# Patient Record
Sex: Male | Born: 1965 | Race: White | Hispanic: No | Marital: Married | Smoking: Never smoker
Health system: Southern US, Community
[De-identification: ages and names within clinical notes are randomized; demographics above are authoritative.]

## PROBLEM LIST (undated history)

## (undated) DIAGNOSIS — E7800 Pure hypercholesterolemia, unspecified: Secondary | ICD-10-CM

## (undated) DIAGNOSIS — I10 Essential (primary) hypertension: Secondary | ICD-10-CM

## (undated) DIAGNOSIS — E119 Type 2 diabetes mellitus without complications: Secondary | ICD-10-CM

## (undated) HISTORY — PX: KIDNEY STONE SURGERY: SHX686

## (undated) HISTORY — PX: TYMPANOSTOMY TUBE PLACEMENT: SHX32

## (undated) HISTORY — PX: FOOT SURGERY: SHX648

## (undated) HISTORY — PX: CORNEAL TRANSPLANT: SHX108

## (undated) HISTORY — PX: TONSILLECTOMY AND ADENOIDECTOMY: SUR1326

---

## 1999-06-01 ENCOUNTER — Ambulatory Visit (HOSPITAL_COMMUNITY): Admission: RE | Admit: 1999-06-01 | Discharge: 1999-06-01 | Payer: Self-pay | Admitting: General Surgery

## 2001-04-15 ENCOUNTER — Other Ambulatory Visit: Admission: RE | Admit: 2001-04-15 | Discharge: 2001-04-15 | Payer: Self-pay | Admitting: Otolaryngology

## 2001-04-29 ENCOUNTER — Ambulatory Visit (HOSPITAL_COMMUNITY): Admission: RE | Admit: 2001-04-29 | Discharge: 2001-04-29 | Payer: Self-pay | Admitting: Otolaryngology

## 2001-04-29 ENCOUNTER — Encounter: Payer: Self-pay | Admitting: Otolaryngology

## 2001-06-19 ENCOUNTER — Ambulatory Visit (HOSPITAL_BASED_OUTPATIENT_CLINIC_OR_DEPARTMENT_OTHER): Admission: RE | Admit: 2001-06-19 | Discharge: 2001-06-19 | Payer: Self-pay | Admitting: Otolaryngology

## 2014-09-26 ENCOUNTER — Encounter (HOSPITAL_BASED_OUTPATIENT_CLINIC_OR_DEPARTMENT_OTHER): Payer: Self-pay

## 2014-09-26 ENCOUNTER — Emergency Department (HOSPITAL_BASED_OUTPATIENT_CLINIC_OR_DEPARTMENT_OTHER)
Admission: EM | Admit: 2014-09-26 | Discharge: 2014-09-26 | Disposition: A | Discharge status: Home / Self Care | Payer: BC Managed Care – PPO | Attending: Emergency Medicine | Admitting: Emergency Medicine

## 2014-09-26 ENCOUNTER — Emergency Department (HOSPITAL_BASED_OUTPATIENT_CLINIC_OR_DEPARTMENT_OTHER): Payer: BC Managed Care – PPO

## 2014-09-26 DIAGNOSIS — X58XXXA Exposure to other specified factors, initial encounter: Secondary | ICD-10-CM | POA: Insufficient documentation

## 2014-09-26 DIAGNOSIS — Y998 Other external cause status: Secondary | ICD-10-CM | POA: Insufficient documentation

## 2014-09-26 DIAGNOSIS — I10 Essential (primary) hypertension: Secondary | ICD-10-CM | POA: Insufficient documentation

## 2014-09-26 DIAGNOSIS — Y9389 Activity, other specified: Secondary | ICD-10-CM | POA: Insufficient documentation

## 2014-09-26 DIAGNOSIS — E119 Type 2 diabetes mellitus without complications: Secondary | ICD-10-CM | POA: Diagnosis not present

## 2014-09-26 DIAGNOSIS — S86911A Strain of unspecified muscle(s) and tendon(s) at lower leg level, right leg, initial encounter: Secondary | ICD-10-CM | POA: Diagnosis not present

## 2014-09-26 DIAGNOSIS — Y9289 Other specified places as the place of occurrence of the external cause: Secondary | ICD-10-CM | POA: Diagnosis not present

## 2014-09-26 DIAGNOSIS — E780 Pure hypercholesterolemia: Secondary | ICD-10-CM | POA: Diagnosis not present

## 2014-09-26 DIAGNOSIS — R52 Pain, unspecified: Secondary | ICD-10-CM

## 2014-09-26 DIAGNOSIS — S8991XA Unspecified injury of right lower leg, initial encounter: Secondary | ICD-10-CM | POA: Diagnosis present

## 2014-09-26 HISTORY — DX: Pure hypercholesterolemia, unspecified: E78.00

## 2014-09-26 HISTORY — DX: Essential (primary) hypertension: I10

## 2014-09-26 HISTORY — DX: Type 2 diabetes mellitus without complications: E11.9

## 2014-09-26 NOTE — Discharge Instructions (Signed)
Joint Sprain °A sprain is a tear or stretch in the ligaments that hold a joint together. Severe sprains may need as Sailer as 3-6 weeks of immobilization and/or exercises to heal completely. Sprained joints should be rested and protected. If not, they can become unstable and prone to re-injury. Proper treatment can reduce your pain, shorten the period of disability, and reduce the risk of repeated injuries. °TREATMENT  °· Rest and elevate the injured joint to reduce pain and swelling. °· Apply ice packs to the injury for 20-30 minutes every 2-3 hours for the next 2-3 days. °· Keep the injury wrapped in a compression bandage or splint as Biegler as the joint is painful or as instructed by your caregiver. °· Do not use the injured joint until it is completely healed to prevent re-injury and chronic instability. Follow the instructions of your caregiver. °· Sam-term sprain management may require exercises and/or treatment by a physical therapist. Taping or special braces may help stabilize the joint until it is completely better. °SEEK MEDICAL CARE IF:  °· You develop increased pain or swelling of the joint. °· You develop increasing redness and warmth of the joint. °· You develop a fever. °· It becomes stiff. °· Your hand or foot gets cold or numb. °Document Released: 10/24/2004 Document Revised: 12/09/2011 Document Reviewed: 10/03/2008 °[employer]® Patient Information ©2015 [employer]. This information is not intended to replace advice given to you by your health care provider. Make sure you discuss any questions you have with your health care provider. ° °

## 2014-09-26 NOTE — ED Notes (Signed)
Pt reports was walking in gas station and felt a pop behind right knee.  Pt reports pain with weight bearing.  Pt wearing knee brace and using crutches upon arrival.  Pt reports taking motrin around the clock.

## 2014-09-26 NOTE — ED Provider Notes (Signed)
CSN: [id number]     Arrival date & time 09/26/14  1136 History   First MD Initiated Contact with Patient 09/26/14 1146     Chief Complaint  Patient presents with  . Knee Pain     (Consider location/radiation/quality/duration/timing/severity/associated sxs/prior Treatment) HPI Comments: Pt states that he has been having posterior right knee pain for 2 weeks and then yesterday he stood up and felt a pop and he has been having pain since. Denies swelling redness or warmth. Has been using a brace and crutches and taking motrin  Patient is a 48 y.o. male presenting with knee pain. The history is provided by the patient.  Knee Pain Location:  Knee Injury: no   Knee location:  R knee Pain details:    Quality:  Aching   Severity:  Moderate   Onset quality:  Gradual Chronicity:  New Dislocation: no   Foreign body present:  No foreign bodies Worsened by:  Activity   Past Medical History  Diagnosis Date  . Hypertension   . High cholesterol   . Diabetes mellitus without complication    Past Surgical History  Procedure Laterality Date  . Corneal transplant    . Foot surgery    . Tympanostomy tube placement    . Kidney stone surgery    . Tonsillectomy and adenoidectomy     No family history on file. History  Substance Use Topics  . Smoking status: Never Smoker   . Smokeless tobacco: Not on file  . Alcohol Use: Yes    Review of Systems  All other systems reviewed and are negative.     Allergies  Review of patient's allergies indicates no known allergies.  Home Medications   Prior to Admission medications   Medication Sig Start Date End Date Taking? Authorizing Provider  GLYBURIDE PO Take by mouth.   Yes Historical Provider, MD  LOSARTAN POTASSIUM PO Take by mouth.   Yes Historical Provider, MD  METFORMIN HCL ER, MOD, PO Take by mouth.   Yes Historical Provider, MD  SIMVASTATIN PO Take by mouth.   Yes Historical Provider, MD   BP 151/90 mmHg  Pulse 95  Temp(Src)  98.3 F (36.8 C) (Oral)  Resp 18  Ht 6' (1.829 m)  Wt 315 lb (142.883 kg)  BMI 42.71 kg/m2  SpO2 97% Physical Exam  Constitutional: He is oriented to person, place, and time. He appears well-developed and well-nourished.  Cardiovascular: Normal rate and regular rhythm.   Pulmonary/Chest: Effort normal.  Musculoskeletal: Normal range of motion.  Full rom or right knee. No deformity, redness warmth or swelling to the area. Pulses intact. No laxity noted in knee  Neurological: He is alert and oriented to person, place, and time. He exhibits normal muscle tone. Coordination normal.  Nursing note and vitals reviewed.   ED Course  Procedures (including critical care time) Labs Review Labs Reviewed - No data to display  Imaging Review Dg Knee 2 Views Right  09/26/2014   CLINICAL DATA:  Popping sensation in the knee with posterior pain.  EXAM: RIGHT KNEE - 1-2 VIEW  COMPARISON:  None.  FINDINGS: No definite joint effusion. No acute osseous abnormality. Minimal patellofemoral osteophytosis. Otherwise, no degenerative changes.  IMPRESSION: No acute findings to explain the patient's pain.   Electronically Signed   By: Leanna BattlesMelinda  Blietz M.D.   On: 09/26/2014 12:15     EKG Interpretation None      MDM   Final diagnoses:  Knee strain, right, initial encounter  Pt to continue to wear a brace. Pt is to see ortho when he gets home. Pt has full rom.no effusion noted    Teressa LowerVrinda Margues Filippini, NP 09/26/14 1307  Purvis SheffieldForrest Harrison, MD 09/27/14 1008

## 2016-05-29 IMAGING — CR DG KNEE 1-2V*R*
2 series · 2 of 2 positions shown · non-contrast
Comparison: None.

CLINICAL DATA: Popping sensation in the knee with posterior pain.

EXAM:
RIGHT KNEE - 1-2 VIEW

[t knee ap right]
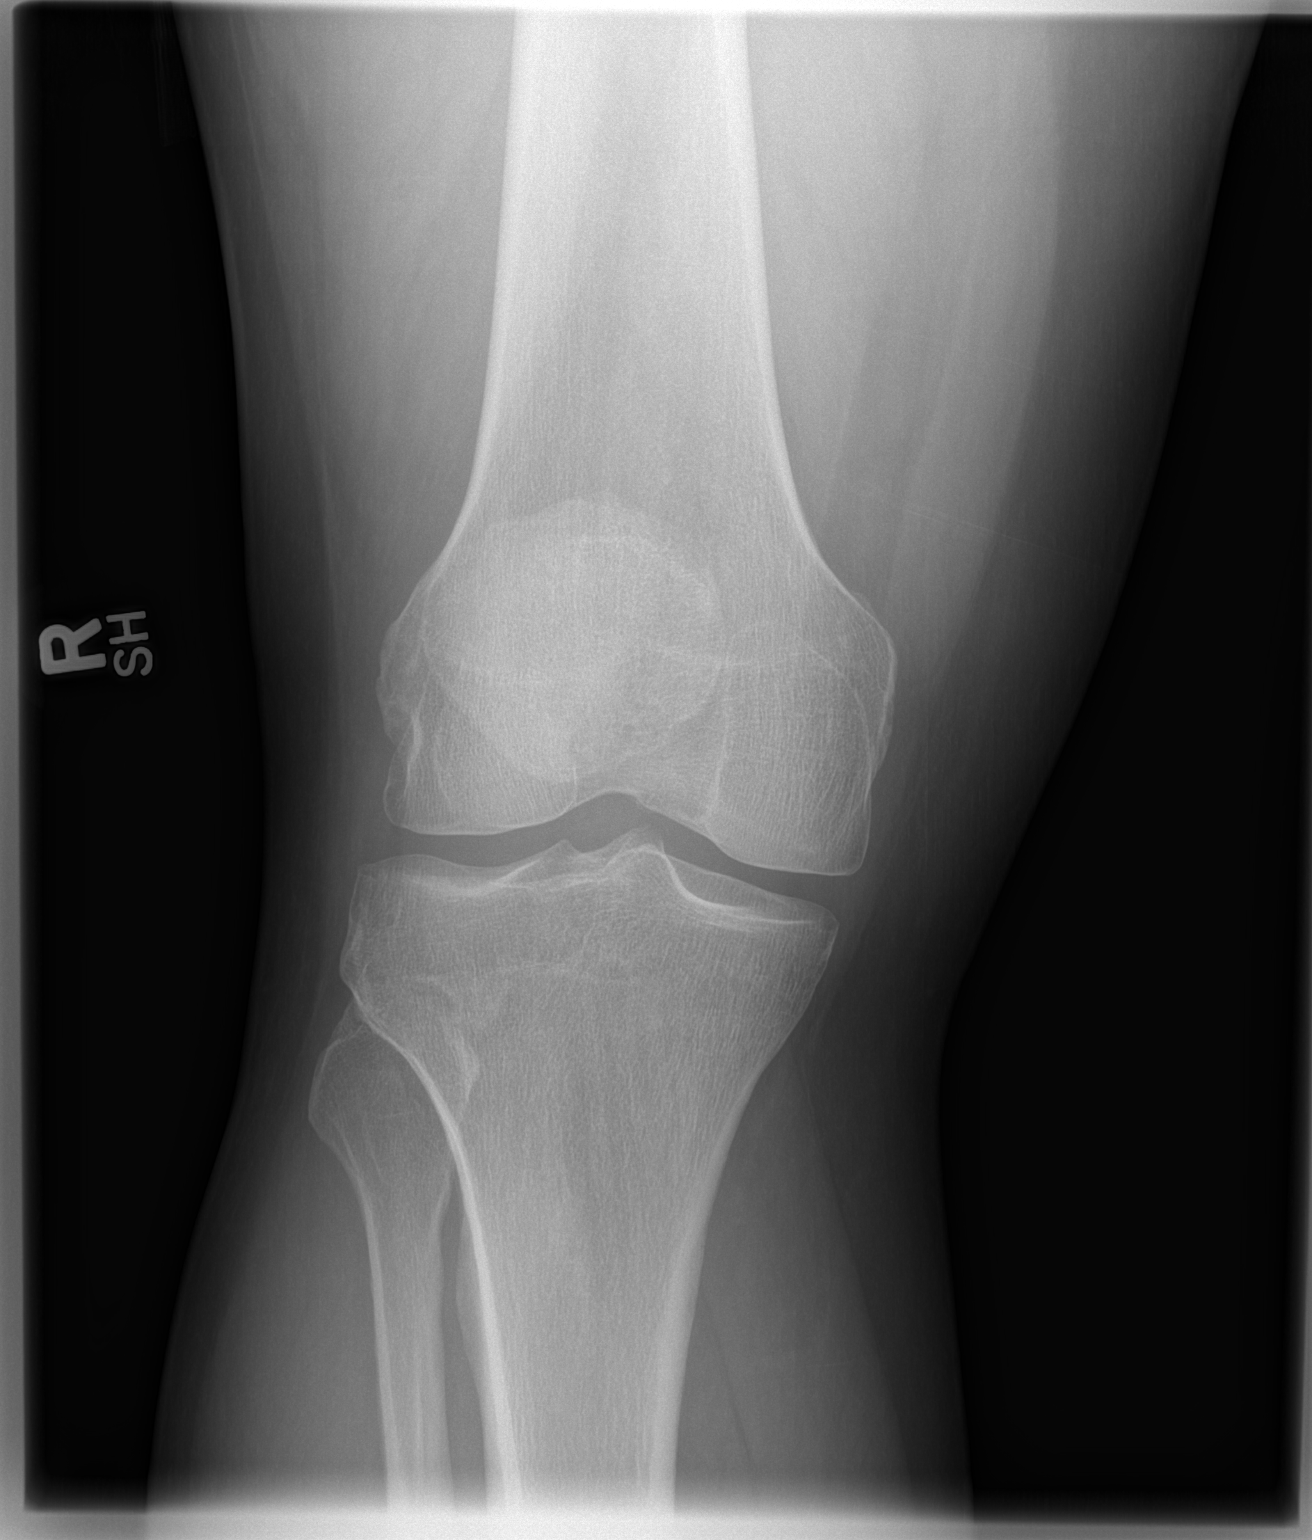

[t knee lat right]
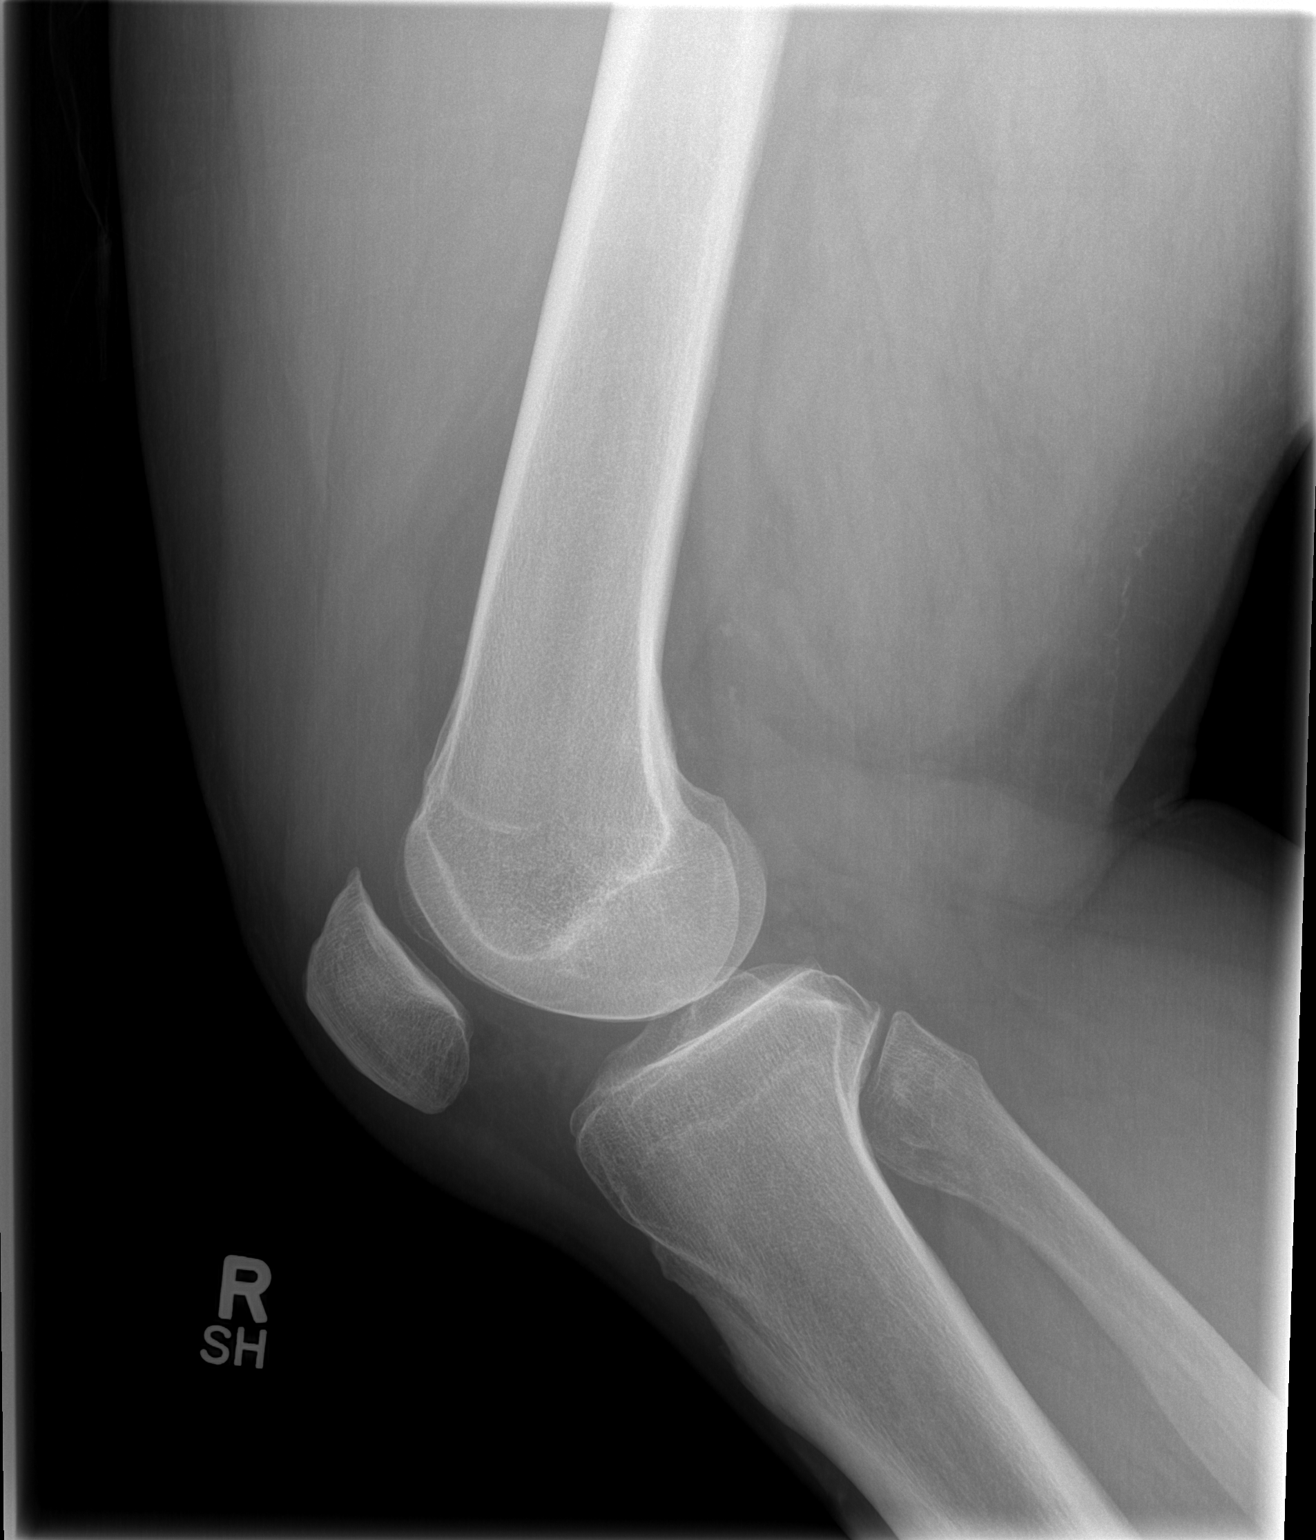

[2 of 2 positions shown; findings below may reference images not displayed]

FINDINGS: No definite joint effusion. No acute osseous abnormality. Minimal
patellofemoral osteophytosis. Otherwise, no degenerative changes.
IMPRESSION: No acute findings to explain the patient's pain.
